# Patient Record
Sex: Female | Born: 2000 | Hispanic: Yes | Marital: Single | Smoking: Never smoker
Health system: Southern US, Community
[De-identification: ages and names within clinical notes are randomized; demographics above are authoritative.]

## PROBLEM LIST (undated history)

## (undated) HISTORY — PX: DENTAL SURGERY: SHX609

---
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
## 2021-05-16 DIAGNOSIS — B355 Tinea imbricata: Secondary | ICD-10-CM | POA: Diagnosis not present

## 2021-05-17 DIAGNOSIS — H5213 Myopia, bilateral: Secondary | ICD-10-CM | POA: Diagnosis not present

## 2021-07-01 DIAGNOSIS — H52223 Regular astigmatism, bilateral: Secondary | ICD-10-CM | POA: Diagnosis not present

## 2021-07-17 DIAGNOSIS — H65199 Other acute nonsuppurative otitis media, unspecified ear: Secondary | ICD-10-CM | POA: Diagnosis not present

## 2021-07-17 DIAGNOSIS — J069 Acute upper respiratory infection, unspecified: Secondary | ICD-10-CM | POA: Diagnosis not present

## 2022-01-23 DIAGNOSIS — Z0000 Encounter for general adult medical examination without abnormal findings: Secondary | ICD-10-CM | POA: Diagnosis not present

## 2022-01-29 DIAGNOSIS — R509 Fever, unspecified: Secondary | ICD-10-CM | POA: Diagnosis not present

## 2022-01-29 DIAGNOSIS — J020 Streptococcal pharyngitis: Secondary | ICD-10-CM | POA: Diagnosis not present

## 2022-02-07 ENCOUNTER — Other Ambulatory Visit
Admission: RE | Admit: 2022-02-07 | Discharge: 2022-02-07 | Disposition: A | Discharge status: Home / Self Care | Payer: Medicaid Other | Attending: Pediatrics | Admitting: Pediatrics

## 2022-02-07 DIAGNOSIS — E119 Type 2 diabetes mellitus without complications: Secondary | ICD-10-CM | POA: Insufficient documentation

## 2022-02-07 DIAGNOSIS — L83 Acanthosis nigricans: Secondary | ICD-10-CM | POA: Insufficient documentation

## 2022-02-07 DIAGNOSIS — E669 Obesity, unspecified: Secondary | ICD-10-CM | POA: Insufficient documentation

## 2022-02-07 LAB — CBC
HCT: 39.7 % (ref 36.0–46.0)
Hemoglobin: 13.1 g/dL (ref 12.0–15.0)
MCH: 27.9 pg (ref 26.0–34.0)
MCHC: 33 g/dL (ref 30.0–36.0)
MCV: 84.5 fL (ref 80.0–100.0)
Platelets: 251 10*3/uL (ref 150–400)
RBC: 4.7 MIL/uL (ref 3.87–5.11)
RDW: 13 % (ref 11.5–15.5)
WBC: 8.9 10*3/uL (ref 4.0–10.5)
nRBC: 0 % (ref 0.0–0.2)

## 2022-02-07 LAB — LIPID PANEL
Cholesterol: 185 mg/dL (ref 0–200)
HDL: 39 mg/dL — ABNORMAL LOW (ref >40)
LDL Cholesterol: 104 mg/dL — ABNORMAL HIGH (ref 0–99)
Total CHOL/HDL Ratio: 4.7 RATIO
Triglycerides: 212 mg/dL — ABNORMAL HIGH (ref <150)
VLDL: 42 mg/dL — ABNORMAL HIGH (ref 0–40)

## 2022-02-07 LAB — COMPREHENSIVE METABOLIC PANEL
ALT: 23 U/L (ref 0–44)
AST: 26 U/L (ref 15–41)
Albumin: 3.9 g/dL (ref 3.5–5.0)
Alkaline Phosphatase: 87 U/L (ref 38–126)
Anion gap: 9 (ref 5–15)
BUN: 11 mg/dL (ref 6–20)
CO2: 21 mmol/L — ABNORMAL LOW (ref 22–32)
Calcium: 9.2 mg/dL (ref 8.9–10.3)
Chloride: 110 mmol/L (ref 98–111)
Creatinine, Ser: 0.69 mg/dL (ref 0.44–1.00)
GFR, Estimated: >60 mL/min (ref >60)
Glucose, Bld: 106 mg/dL — ABNORMAL HIGH (ref 70–99)
Potassium: 3.6 mmol/L (ref 3.5–5.1)
Sodium: 140 mmol/L (ref 135–145)
Total Bilirubin: 0.7 mg/dL (ref 0.3–1.2)
Total Protein: 7.6 g/dL (ref 6.5–8.1)

## 2022-02-07 LAB — TSH: TSH: 2.774 u[IU]/mL (ref 0.350–4.500)

## 2022-02-07 LAB — HEMOGLOBIN A1C
Hgb A1c MFr Bld: 5.5 % (ref 4.8–5.6)
Mean Plasma Glucose: 111.15 mg/dL

## 2022-02-08 LAB — INSULIN, RANDOM: Insulin: 3.9 u[IU]/mL (ref 2.6–24.9)

## 2022-02-15 LAB — 25-HYDROXY VITAMIN D LCMS D2+D3
25-Hydroxy, Vitamin D-2: <1 ng/mL
25-Hydroxy, Vitamin D-3: 14 ng/mL
25-Hydroxy, Vitamin D: 15 ng/mL — ABNORMAL LOW

## 2022-02-20 DIAGNOSIS — E785 Hyperlipidemia, unspecified: Secondary | ICD-10-CM | POA: Diagnosis not present

## 2022-02-20 DIAGNOSIS — E559 Vitamin D deficiency, unspecified: Secondary | ICD-10-CM | POA: Diagnosis not present

## 2022-02-20 DIAGNOSIS — I10 Essential (primary) hypertension: Secondary | ICD-10-CM | POA: Diagnosis not present

## 2022-04-08 DIAGNOSIS — H6500 Acute serous otitis media, unspecified ear: Secondary | ICD-10-CM | POA: Diagnosis not present

## 2022-04-08 DIAGNOSIS — R509 Fever, unspecified: Secondary | ICD-10-CM | POA: Diagnosis not present

## 2023-03-02 DIAGNOSIS — R112 Nausea with vomiting, unspecified: Secondary | ICD-10-CM | POA: Diagnosis not present

## 2023-03-02 DIAGNOSIS — Z03818 Encounter for observation for suspected exposure to other biological agents ruled out: Secondary | ICD-10-CM | POA: Diagnosis not present

## 2023-03-02 DIAGNOSIS — R197 Diarrhea, unspecified: Secondary | ICD-10-CM | POA: Diagnosis not present

## 2023-03-27 DIAGNOSIS — A084 Viral intestinal infection, unspecified: Secondary | ICD-10-CM | POA: Diagnosis not present

## 2023-03-27 DIAGNOSIS — R42 Dizziness and giddiness: Secondary | ICD-10-CM | POA: Diagnosis not present

## 2023-04-28 DIAGNOSIS — H6692 Otitis media, unspecified, left ear: Secondary | ICD-10-CM | POA: Diagnosis not present

## 2023-05-07 DIAGNOSIS — H9201 Otalgia, right ear: Secondary | ICD-10-CM | POA: Diagnosis not present

## 2023-08-20 DIAGNOSIS — R519 Headache, unspecified: Secondary | ICD-10-CM | POA: Diagnosis not present

## 2023-08-20 DIAGNOSIS — R42 Dizziness and giddiness: Secondary | ICD-10-CM | POA: Diagnosis not present

## 2023-09-10 DIAGNOSIS — S46912A Strain of unspecified muscle, fascia and tendon at shoulder and upper arm level, left arm, initial encounter: Secondary | ICD-10-CM | POA: Diagnosis not present

## 2024-01-30 DIAGNOSIS — S0502XA Injury of conjunctiva and corneal abrasion without foreign body, left eye, initial encounter: Secondary | ICD-10-CM | POA: Diagnosis not present

## 2024-02-02 DIAGNOSIS — K648 Other hemorrhoids: Secondary | ICD-10-CM | POA: Diagnosis not present

## 2024-02-09 DIAGNOSIS — H6692 Otitis media, unspecified, left ear: Secondary | ICD-10-CM | POA: Diagnosis not present

## 2024-02-09 DIAGNOSIS — K5900 Constipation, unspecified: Secondary | ICD-10-CM | POA: Diagnosis not present

## 2024-03-11 DIAGNOSIS — B354 Tinea corporis: Secondary | ICD-10-CM | POA: Diagnosis not present

## 2024-04-29 DIAGNOSIS — R1012 Left upper quadrant pain: Secondary | ICD-10-CM | POA: Diagnosis not present

## 2024-04-29 DIAGNOSIS — R1030 Lower abdominal pain, unspecified: Secondary | ICD-10-CM | POA: Diagnosis not present
